# Patient Record
Sex: Female | Born: 1995 | Race: Black or African American | Hispanic: No | Marital: Single | Smoking: Never smoker
Health system: Southern US, Community
[De-identification: ages and names within clinical notes are randomized; demographics above are authoritative.]

## PROBLEM LIST (undated history)

## (undated) DIAGNOSIS — D6800 Von Willebrand disease, unspecified: Secondary | ICD-10-CM

---

## 2017-12-15 ENCOUNTER — Encounter (HOSPITAL_BASED_OUTPATIENT_CLINIC_OR_DEPARTMENT_OTHER): Payer: Self-pay | Admitting: *Deleted

## 2017-12-15 ENCOUNTER — Emergency Department (HOSPITAL_BASED_OUTPATIENT_CLINIC_OR_DEPARTMENT_OTHER)
Admission: EM | Admit: 2017-12-15 | Discharge: 2017-12-16 | Disposition: A | Discharge status: Home / Self Care | Attending: Emergency Medicine | Admitting: Emergency Medicine

## 2017-12-15 ENCOUNTER — Other Ambulatory Visit: Payer: Self-pay

## 2017-12-15 DIAGNOSIS — R350 Frequency of micturition: Secondary | ICD-10-CM | POA: Diagnosis not present

## 2017-12-15 DIAGNOSIS — R51 Headache: Secondary | ICD-10-CM | POA: Insufficient documentation

## 2017-12-15 DIAGNOSIS — R519 Headache, unspecified: Secondary | ICD-10-CM

## 2017-12-15 DIAGNOSIS — Z3202 Encounter for pregnancy test, result negative: Secondary | ICD-10-CM | POA: Insufficient documentation

## 2017-12-15 HISTORY — DX: Von Willebrand's disease: D68.0

## 2017-12-15 HISTORY — DX: Von Willebrand disease, unspecified: D68.00

## 2017-12-15 LAB — URINALYSIS, ROUTINE W REFLEX MICROSCOPIC
BILIRUBIN URINE: NEGATIVE
Glucose, UA: NEGATIVE mg/dL
Hgb urine dipstick: NEGATIVE
KETONES UR: 15 mg/dL — AB
Nitrite: NEGATIVE
PROTEIN: NEGATIVE mg/dL
Specific Gravity, Urine: >1.03 — ABNORMAL HIGH (ref 1.005–1.030)
pH: 6 (ref 5.0–8.0)

## 2017-12-15 LAB — URINALYSIS, MICROSCOPIC (REFLEX)

## 2017-12-15 LAB — PREGNANCY, URINE: PREG TEST UR: NEGATIVE

## 2017-12-15 NOTE — ED Triage Notes (Signed)
HA x 6 hours PTA. Also reports diarrhea that started today-4 episodes in the last 24 hours.  Denies vomiting.

## 2017-12-16 MED ORDER — NITROFURANTOIN MONOHYD MACRO 100 MG PO CAPS: 100.0000 mg | ORAL_CAPSULE | Freq: Two times a day (BID) | ORAL | 0 refills | Status: AC

## 2017-12-16 NOTE — ED Provider Notes (Signed)
[facility] EMERGENCY DEPARTMENT Provider Note   CSN: [id number] Arrival date & time: 12/15/17  2039     History   Chief Complaint Chief Complaint  Patient presents with  . Headache    HPI Anita Cannon is a 22 y.o. female.  Patient presents the emergency department with complaint of headache starting today.  Patient has a history of migraines which are typically described as throbbing with associated nausea, sometimes vomiting, and photophobia.  Her headache today felt similar but was less severe with nausea and no vomiting and mild photosensitivity.  No treatments prior to arrival.  At time of exam, symptoms had resolved.  Patient also reports having some generalized body aches, especially in her bilateral legs today.  She was concerned that she was getting the flu.  She denies any fever, URI symptoms, cough.  No chest pain or shortness of breath.  No abdominal pain.  Patient has had urinary frequency that has been bothersome for the past couple of weeks but no dysuria, hematuria, or vaginal discharge.  No skin rashes.  No head injury, neck pain, confusion. The onset of this condition was acute. The course is improving. Alleviating factors: none.        Past Medical History:  Diagnosis Date  . Von Willebrand disease (HCC)     There are no active problems to display for this patient.   History reviewed. No pertinent surgical history.  OB History    No data available       Home Medications    Prior to Admission medications   Medication Sig Start Date End Date Taking? Authorizing Provider  nitrofurantoin, macrocrystal-monohydrate, (MACROBID) 100 MG capsule Take 1 capsule (100 mg total) by mouth 2 (two) times daily. 12/16/17   Renne CriglerGeiple, Merie Wulf, PA-C    Family History History reviewed. No pertinent family history.  Social History Social History   Tobacco Use  . Smoking status: Never Smoker  . Smokeless tobacco: Never Used  Substance Use Topics  . Alcohol  use: No    Frequency: Never  . Drug use: No     Allergies   Sulfa antibiotics   Review of Systems Review of Systems  Constitutional: Negative for fever.  HENT: Negative for congestion, dental problem, rhinorrhea and sinus pressure.   Eyes: Positive for photophobia. Negative for discharge, redness and visual disturbance.  Respiratory: Negative for shortness of breath.   Cardiovascular: Negative for chest pain.  Gastrointestinal: Negative for nausea and vomiting.  Musculoskeletal: Positive for myalgias. Negative for gait problem, neck pain and neck stiffness.  Skin: Negative for rash.  Neurological: Positive for headaches. Negative for syncope, speech difficulty, weakness, light-headedness and numbness.  Psychiatric/Behavioral: Negative for confusion.     Physical Exam Updated Vital Signs BP 116/73 (BP Location: Right Arm)   Pulse 92   Temp 99 F (37.2 C) (Oral)   Resp 16   Ht 5\' 4"  (1.626 m)   Wt 54.4 kg (120 lb)   SpO2 100%   BMI 20.60 kg/m   Physical Exam  Constitutional: She is oriented to person, place, and time. She appears well-developed and well-nourished.  HENT:  Head: Normocephalic and atraumatic.  Right Ear: Tympanic membrane, external ear and ear canal normal.  Left Ear: Tympanic membrane, external ear and ear canal normal.  Nose: Nose normal.  Mouth/Throat: Uvula is midline, oropharynx is clear and moist and mucous membranes are normal.  Eyes: Conjunctivae, EOM and lids are normal. Pupils are equal, round, and reactive to light. Right  eye exhibits no nystagmus. Left eye exhibits no nystagmus.  Neck: Normal range of motion. Neck supple.  Cardiovascular: Normal rate and regular rhythm.  Pulmonary/Chest: Effort normal and breath sounds normal.  Abdominal: Soft. There is no tenderness.  Musculoskeletal: She exhibits no tenderness.       Cervical back: She exhibits normal range of motion, no tenderness and no bony tenderness.  Neurological: She is alert and  oriented to person, place, and time. She has normal strength and normal reflexes. No cranial nerve deficit or sensory deficit. She displays a negative Romberg sign. Coordination and gait normal. GCS eye subscore is 4. GCS verbal subscore is 5. GCS motor subscore is 6.  Skin: Skin is warm and dry.  Psychiatric: She has a normal mood and affect.  Nursing note and vitals reviewed.    ED Treatments / Results  Labs (all labs ordered are listed, but only abnormal results are displayed) Labs Reviewed  URINALYSIS, ROUTINE W REFLEX MICROSCOPIC - Abnormal; Notable for the following components:      Result Value   APPearance CLOUDY (*)    Specific Gravity, Urine >1.030 (*)    Ketones, ur 15 (*)    Leukocytes, UA SMALL (*)    All other components within normal limits  URINALYSIS, MICROSCOPIC (REFLEX) - Abnormal; Notable for the following components:   Bacteria, UA FEW (*)    Squamous Epithelial / LPF 6-30 (*)    All other components within normal limits  PREGNANCY, URINE    Procedures Procedures (including critical care time)   Initial Impression / Assessment and Plan / ED Course  I have reviewed the triage vital signs and the nursing notes.  Pertinent labs & imaging results that were available during my care of the patient were reviewed by me and considered in my medical decision making (see chart for details).     Patient seen and examined.  Patient had a headache upon arrival to the emergency department however this has spontaneously resolved and she is feeling much better.  We discussed results of her UA.  Given the fact that she has urinary frequency, feel that a 5-day course of Macrobid would be reasonable.  Her exam is normal.  Vital signs reviewed and are as follows: BP 116/61   Pulse 84   Temp 99 F (37.2 C) (Oral)   Resp 18   Ht 5\' 4"  (1.626 m)   Wt 54.4 kg (120 lb)   SpO2 100%   BMI 20.60 kg/m   Patient urged to return with worsening symptoms or other concerns.  Patient verbalized understanding and agrees with plan.    Final Clinical Impressions(s) / ED Diagnoses   Final diagnoses:  Acute nonintractable headache, unspecified headache type  Urinary frequency   Patient with headache and body aches today.  She does not have any other flulike symptoms.  Normal neuro exam. Patient without high-risk features of headache including: sudden onset/thunderclap HA, no similar headache in past, altered mental status, accompanying seizure, headache with exertion, age > 53, history of immunocompromise, neck or shoulder pain, fever, use of anticoagulation, family history of spontaneous SAH, concomitant drug use, toxic exposure.   Patient has a normal complete neurological exam, normal vital signs, normal level of consciousness, no signs of meningismus, is well-appearing/non-toxic appearing, no signs of trauma.   Imaging with CT/MRI not indicated given history and physical exam findings.   No dangerous or life-threatening conditions suspected or identified by history, physical exam, and by work-up. No indications for hospitalization  identified.   Urinary frequency without dysuria or suprapubic pain.  No vaginal symptoms per patient.  6-30 white blood cells with mild leuk esterase on UA.  Given bothersome symptoms, will treat empirically to see if this helps improve her situation.  No signs of pyelonephritis or sepsis.  ED Discharge Orders        Ordered    nitrofurantoin, macrocrystal-monohydrate, (MACROBID) 100 MG capsule  2 times daily     12/16/17 0045       Renne Crigler, PA-C 12/16/17 0054    Gilda Crease, MD 12/16/17 [phone]

## 2017-12-16 NOTE — Discharge Instructions (Signed)
Please read and follow all provided instructions.  Your diagnoses today include:  1. Acute nonintractable headache, unspecified headache type   2. Urinary frequency     Tests performed today include:  Urine test - suggests that you may have an infection in your bladder  Vital signs. See below for your results today.   Medications prescribed:   Macrobid - antibiotic for urinary tract infection  You have been prescribed an antibiotic medicine: take the entire course of medicine even if you are feeling better. Stopping early can cause the antibiotic not to work.  Home care instructions:  Follow any educational materials contained in this packet.  Follow-up instructions: Please follow-up with your primary care provider in 3 days if symptoms are not resolved for further evaluation of your symptoms.  Return instructions:   Please return to the Emergency Department if you experience worsening symptoms.   Return with fever, worsening pain, persistent vomiting, worsening pain in your back.   Please return if you have any other emergent concerns.  Additional Information:  Your vital signs today were: BP 116/73 (BP Location: Right Arm)    Pulse 92    Temp 99 F (37.2 C) (Oral)    Resp 16    Ht 5\' 4"  (1.626 m)    Wt 54.4 kg (120 lb)    SpO2 100%    BMI 20.60 kg/m  If your blood pressure (BP) was elevated above 135/85 this visit, please have this repeated by your doctor within one month. --------------

## 2017-12-31 ENCOUNTER — Ambulatory Visit (HOSPITAL_COMMUNITY)
Admission: EM | Admit: 2017-12-31 | Discharge: 2017-12-31 | Disposition: A | Discharge status: Home / Self Care | Attending: Family Medicine | Admitting: Family Medicine

## 2017-12-31 ENCOUNTER — Encounter (HOSPITAL_COMMUNITY): Payer: Self-pay | Admitting: Emergency Medicine

## 2017-12-31 DIAGNOSIS — R69 Illness, unspecified: Secondary | ICD-10-CM

## 2017-12-31 DIAGNOSIS — J111 Influenza due to unidentified influenza virus with other respiratory manifestations: Secondary | ICD-10-CM

## 2017-12-31 MED ORDER — OSELTAMIVIR PHOSPHATE 75 MG PO CAPS: 75.0000 mg | ORAL_CAPSULE | Freq: Two times a day (BID) | ORAL | 0 refills | Status: AC

## 2017-12-31 MED ORDER — HYDROCODONE-HOMATROPINE 5-1.5 MG/5ML PO SYRP: 5.0000 mL | ORAL_SOLUTION | Freq: Four times a day (QID) | ORAL | 0 refills | Status: AC | PRN

## 2017-12-31 NOTE — ED Provider Notes (Signed)
  [facility]   [id number] 12/31/17 Arrival Time: 1304  ASSESSMENT & PLAN:  1. Influenza-like illness     Meds ordered this encounter  Medications  . HYDROcodone-homatropine (HYCODAN) 5-1.5 MG/5ML syrup    Sig: Take 5 mLs by mouth every 6 (six) hours as needed for cough.    Dispense:  90 mL    Refill:  0  . oseltamivir (TAMIFLU) 75 MG capsule    Sig: Take 1 capsule (75 mg total) by mouth 2 (two) times daily for 5 days.    Dispense:  10 capsule    Refill:  0   School note given. Cough medication sedation precautions. Discussed typical duration of symptoms. OTC symptom care as needed. Ensure adequate fluid intake and rest. May f/u with PCP or here as needed.  Reviewed expectations re: course of current medical issues. Questions answered. Outlined signs and symptoms indicating need for more acute intervention. Patient verbalized understanding. After Visit Summary given.   SUBJECTIVE: History from: patient.  Anita Cannon is a 22 y.o. female who presents with complaint of nasal congestion, post-nasal drainage, and a persistent dry cough. Onset abrupt, approximately 2 days ago. Overall fatigued with body aches. SOB: none. Wheezing: none. Fever: yes, subjective. Overall normal PO intake without n/v. Sick contacts: no. OTC treatment: none.  Received flu shot this year: yes.  Social History   Tobacco Use  Smoking Status Never Smoker  Smokeless Tobacco Never Used    ROS: As per HPI.   OBJECTIVE:  Vitals:   12/31/17 1326 12/31/17 1327  BP:  (!) 93/54  Pulse:  (!) 102  Resp:  16  Temp:  99.5 F (37.5 C)  TempSrc:  Oral  SpO2:  97%  Weight: 120 lb (54.4 kg)      General appearance: alert; appears fatigued HEENT: nasal congestion; clear runny nose; throat irritation secondary to post-nasal drainage Neck: supple without LAD Lungs: unlabored respirations, symmetrical air entry; cough: moderate; no respiratory distress Skin: warm and dry Psychological:  alert and cooperative; normal mood and affect   Allergies  Allergen Reactions  . Sulfa Antibiotics     Past Medical History:  Diagnosis Date  . Von Willebrand disease (HCC)    No family history on file. Social History   Socioeconomic History  . Marital status: Single    Spouse name: Not on file  . Number of children: Not on file  . Years of education: Not on file  . Highest education level: Not on file  Social Needs  . Financial resource strain: Not on file  . Food insecurity - worry: Not on file  . Food insecurity - inability: Not on file  . Transportation needs - medical: Not on file  . Transportation needs - non-medical: Not on file  Occupational History  . Not on file  Tobacco Use  . Smoking status: Never Smoker  . Smokeless tobacco: Never Used  Substance and Sexual Activity  . Alcohol use: No    Frequency: Never  . Drug use: No  . Sexual activity: No  Other Topics Concern  . Not on file  Social History Narrative  . Not on file           Mardella LaymanHagler, Jaden Batchelder, MD 12/31/17 1346

## 2017-12-31 NOTE — ED Triage Notes (Signed)
PT reports congestion that started 2 days ago.

## 2017-12-31 NOTE — Discharge Instructions (Signed)

## 2018-09-11 ENCOUNTER — Other Ambulatory Visit: Payer: Self-pay

## 2018-09-11 ENCOUNTER — Emergency Department (HOSPITAL_COMMUNITY)
Admission: EM | Admit: 2018-09-11 | Discharge: 2018-09-12 | Disposition: A | Discharge status: Home / Self Care | Attending: Emergency Medicine | Admitting: Emergency Medicine

## 2018-09-11 ENCOUNTER — Encounter (HOSPITAL_COMMUNITY): Payer: Self-pay | Admitting: Emergency Medicine

## 2018-09-11 DIAGNOSIS — R040 Epistaxis: Secondary | ICD-10-CM

## 2018-09-11 DIAGNOSIS — Z79899 Other long term (current) drug therapy: Secondary | ICD-10-CM | POA: Insufficient documentation

## 2018-09-11 DIAGNOSIS — D680 Von Willebrand's disease: Secondary | ICD-10-CM | POA: Insufficient documentation

## 2018-09-11 DIAGNOSIS — R55 Syncope and collapse: Secondary | ICD-10-CM | POA: Diagnosis not present

## 2018-09-11 LAB — CBG MONITORING, ED: GLUCOSE-CAPILLARY: 158 mg/dL — AB (ref 70–99)

## 2018-09-11 MED ORDER — LIDOCAINE-EPINEPHRINE (PF) 2 %-1:200000 IJ SOLN
10.0000 mL | Freq: Once | INTRAMUSCULAR | Status: AC
  Administered 2018-09-11: 10 mL
  Filled 2018-09-11: qty 10

## 2018-09-11 NOTE — ED Triage Notes (Signed)
Patient is from home, called EMS for a nose bleed tonight.  Patient has Von Willebrand's.  Patient was given afrin which helped her nosebleed.  She then went pale, diaphoretic and passed out.  Patient does have some nausea, no vomiting.  No hypotension per EMS.

## 2018-09-11 NOTE — ED Provider Notes (Signed)
[facility] EMERGENCY DEPARTMENT Provider Note   CSN: [id number] Arrival date & time: 09/11/18  2329     History   Chief Complaint Chief Complaint  Patient presents with  . Loss of Consciousness    HPI Anita Cannon is a 22 y.o. female.  HPI  The pt is a 22 y/o female -  Hx of VWD, no sig hx of bleeding - [place] - presents with nose bleed - likely from the L nostril - started after blowing her nose this evening - then had syncopal episode on the commode - came around as the paramedics came to get her - she has no c/o other than mild nose bleed - improved after EMS gave afrin.  She has had mild URI X 2 days - hence runny nose with blowing which started this.  Past Medical History:  Diagnosis Date  . Von Willebrand disease (HCC)     There are no active problems to display for this patient.   History reviewed. No pertinent surgical history.   OB History   None      Home Medications    Prior to Admission medications   Medication Sig Start Date End Date Taking? Authorizing Provider  tranexamic acid (LYSTEDA) 650 MG TABS tablet Take 1,300 mg by mouth 3 (three) times daily as needed. For a maximum of 5 days during monthly menstruation 07/30/18  Yes [provider]  HYDROcodone-homatropine (HYCODAN) 5-1.5 MG/5ML syrup Take 5 mLs by mouth every 6 (six) hours as needed for cough. Patient not taking: Reported on 09/12/2018 12/31/17   Mardella Layman, MD  nitrofurantoin, macrocrystal-monohydrate, (MACROBID) 100 MG capsule Take 1 capsule (100 mg total) by mouth 2 (two) times daily. Patient not taking: Reported on 09/12/2018 12/16/17   Renne Crigler, PA-C    Family History No family history on file.  Social History Social History   Tobacco Use  . Smoking status: Never Smoker  . Smokeless tobacco: Never Used  Substance Use Topics  . Alcohol use: No    Frequency: Never  . Drug use: No     Allergies   Sulfa antibiotics   Review of  Systems Review of Systems  HENT: Positive for nosebleeds.   Respiratory: Negative for cough and shortness of breath.   Gastrointestinal: Negative for nausea.  Neurological: Positive for syncope.     Physical Exam Updated Vital Signs BP (!) 134/95   Pulse (!) 104   Temp 98.3 F (36.8 C) (Oral)   Resp 20   Ht 1.626 m (5\' 4" )   Wt 60.8 kg   SpO2 100%   BMI 23.00 kg/m   Physical Exam  Constitutional: She appears well-developed and well-nourished. No distress.  HENT:  Head: Normocephalic and atraumatic.  Mouth/Throat: Oropharynx is clear and moist. No oropharyngeal exudate.  No blood in OP, has blood in L nare -- small am't in R nare - no active bleeding.  Eyes: Pupils are equal, round, and reactive to light. Conjunctivae and EOM are normal. Right eye exhibits no discharge. Left eye exhibits no discharge. No scleral icterus.  Neck: Normal range of motion. Neck supple. No JVD present. No thyromegaly present.  Cardiovascular: Normal rate, regular rhythm, normal heart sounds and intact distal pulses. Exam reveals no gallop and no friction rub.  No murmur heard. Pulmonary/Chest: Effort normal and breath sounds normal. No respiratory distress. She has no wheezes. She has no rales.  Abdominal: Soft. Bowel sounds are normal. She exhibits no distension and no mass.  There is no tenderness.  Musculoskeletal: Normal range of motion. She exhibits no edema or tenderness.  Lymphadenopathy:    She has no cervical adenopathy.  Neurological: She is alert. Coordination normal.  Skin: Skin is warm and dry. No rash noted. No erythema.  Psychiatric: She has a normal mood and affect. Her behavior is normal.  Nursing note and vitals reviewed.    ED Treatments / Results  Labs (all labs ordered are listed, but only abnormal results are displayed) Labs Reviewed  CBC WITH DIFFERENTIAL/PLATELET - Abnormal; Notable for the following components:      Result Value   Hemoglobin 11.7 (*)    HCT 34.9 (*)     All other components within normal limits  CBG MONITORING, ED - Abnormal; Notable for the following components:   Glucose-Capillary 158 (*)    All other components within normal limits    EKG EKG Interpretation  Date/Time:  Saturday September 11 2018 23:35:45 EDT Ventricular Rate:  101 PR Interval:    QRS Duration: 89 QT Interval:  346 QTC Calculation: 449 R Axis:   71 Text Interpretation:  Sinus tachycardia No old tracing to compare ECG OTHERWISE WITHIN NORMAL LIMITS Confirmed by Eber Hong ([id number]) on 09/11/2018 11:38:17 PM   Radiology No results found.  Procedures .Epistaxis Management Date/Time: 09/12/2018 1:09 AM Performed by: Eber Hong, MD Authorized by: Eber Hong, MD   Consent:    Consent obtained:  Verbal   Consent given by:  Patient   Risks discussed:  Bleeding, infection, nasal injury and pain   Alternatives discussed:  No treatment and delayed treatment Anesthesia (see MAR for exact dosages):    Anesthesia method:  Topical application   Topical anesthetic:  Epinephrine Procedure details:    Treatment site:  L anterior   Treatment method:  Silver nitrate   Treatment complexity:  Limited   Treatment episode: initial   Post-procedure details:    Assessment:  Bleeding stopped   Patient tolerance of procedure:  Tolerated well, no immediate complications Comments:         (including critical care time)  Medications Ordered in ED Medications  lidocaine-EPINEPHrine (XYLOCAINE W/EPI) 2 %-1:200000 (PF) injection 10 mL (10 mLs Other Given by Other 09/11/18 2351)     Initial Impression / Assessment and Plan / ED Course  I have reviewed the triage vital signs and the nursing notes.  Pertinent labs & imaging results that were available during my care of the patient were reviewed by me and considered in my medical decision making (see chart for details).  Clinical Course as of Sep 12 110  Sun Sep 12, 2018  0020 Hgb Is 11.7, no tachcardia, EKG  unremarkable   [BM]    Clinical Course User Index [BM] Eber Hong, MD    Wll appearing, Check Hgb and EKG given Syncope - though likely situational Epistaxis management  Final Clinical Impressions(s) / ED Diagnoses   Final diagnoses:  Epistaxis  Syncope, unspecified syncope type      Eber Hong, MD 09/12/18 0111

## 2018-09-12 LAB — CBC WITH DIFFERENTIAL/PLATELET
Abs Immature Granulocytes: 0.06 10*3/uL (ref 0.00–0.07)
Basophils Absolute: 0.1 10*3/uL (ref 0.0–0.1)
Basophils Relative: 1 %
EOS ABS: 0.3 10*3/uL (ref 0.0–0.5)
EOS PCT: 4 %
HEMATOCRIT: 34.9 % — AB (ref 36.0–46.0)
Hemoglobin: 11.7 g/dL — ABNORMAL LOW (ref 12.0–15.0)
Immature Granulocytes: 1 %
LYMPHS ABS: 2.7 10*3/uL (ref 0.7–4.0)
Lymphocytes Relative: 38 %
MCH: 27.6 pg (ref 26.0–34.0)
MCHC: 33.5 g/dL (ref 30.0–36.0)
MCV: 82.3 fL (ref 80.0–100.0)
Monocytes Absolute: 0.7 10*3/uL (ref 0.1–1.0)
Monocytes Relative: 10 %
NEUTROS PCT: 46 %
Neutro Abs: 3.3 10*3/uL (ref 1.7–7.7)
Platelets: 240 10*3/uL (ref 150–400)
RBC: 4.24 MIL/uL (ref 3.87–5.11)
RDW: 13.2 % (ref 11.5–15.5)
WBC: 7.1 10*3/uL (ref 4.0–10.5)
nRBC: 0 % (ref 0.0–0.2)

## 2018-09-12 NOTE — Discharge Instructions (Signed)
Please apply a vaseline to the inside of the nostrils twice daily for the next week - this will help to prevent rebleeding If you do rebleed, please blow the clot out, apply afrin and hold pressure which may stop the bleeding.  Avoid sneezing and picking Use a humidifier for the next week ER if the bleeding continues or won't stop

## 2018-09-12 NOTE — ED Notes (Signed)
Patient left at this time with all belongings. 

## 2018-09-12 NOTE — ED Notes (Signed)
EDP at bedside performing bedside procedure

## 2019-09-01 ENCOUNTER — Other Ambulatory Visit: Payer: Self-pay

## 2019-09-01 DIAGNOSIS — Z20822 Contact with and (suspected) exposure to covid-19: Secondary | ICD-10-CM

## 2019-09-03 LAB — NOVEL CORONAVIRUS, NAA: SARS-CoV-2, NAA: NOT DETECTED

## 2019-10-26 ENCOUNTER — Emergency Department
Admission: EM | Admit: 2019-10-26 | Discharge: 2019-10-26 | Disposition: A | Discharge status: Home / Self Care | Payer: BC Managed Care – PPO | Attending: Emergency Medicine | Admitting: Emergency Medicine

## 2019-10-26 ENCOUNTER — Emergency Department: Payer: BC Managed Care – PPO

## 2019-10-26 ENCOUNTER — Other Ambulatory Visit: Payer: Self-pay

## 2019-10-26 ENCOUNTER — Encounter: Payer: Self-pay | Admitting: Intensive Care

## 2019-10-26 DIAGNOSIS — Z79899 Other long term (current) drug therapy: Secondary | ICD-10-CM | POA: Insufficient documentation

## 2019-10-26 DIAGNOSIS — R109 Unspecified abdominal pain: Secondary | ICD-10-CM

## 2019-10-26 LAB — COMPREHENSIVE METABOLIC PANEL
ALT: 15 U/L (ref 0–44)
AST: 19 U/L (ref 15–41)
Albumin: 4.7 g/dL (ref 3.5–5.0)
Alkaline Phosphatase: 82 U/L (ref 38–126)
Anion gap: 8 (ref 5–15)
BUN: 12 mg/dL (ref 6–20)
CO2: 22 mmol/L (ref 22–32)
Calcium: 9.3 mg/dL (ref 8.9–10.3)
Chloride: 108 mmol/L (ref 98–111)
Creatinine, Ser: 0.63 mg/dL (ref 0.44–1.00)
GFR calc Af Amer: >60 mL/min (ref >60)
GFR calc non Af Amer: >60 mL/min (ref >60)
Glucose, Bld: 94 mg/dL (ref 70–99)
Potassium: 4 mmol/L (ref 3.5–5.1)
Sodium: 138 mmol/L (ref 135–145)
Total Bilirubin: 0.5 mg/dL (ref 0.3–1.2)
Total Protein: 7.5 g/dL (ref 6.5–8.1)

## 2019-10-26 LAB — CBC
HCT: 34.9 % — ABNORMAL LOW (ref 36.0–46.0)
Hemoglobin: 12.3 g/dL (ref 12.0–15.0)
MCH: 28.7 pg (ref 26.0–34.0)
MCHC: 35.2 g/dL (ref 30.0–36.0)
MCV: 81.5 fL (ref 80.0–100.0)
Platelets: 259 10*3/uL (ref 150–400)
RBC: 4.28 MIL/uL (ref 3.87–5.11)
RDW: 13.3 % (ref 11.5–15.5)
WBC: 8.8 10*3/uL (ref 4.0–10.5)
nRBC: 0 % (ref 0.0–0.2)

## 2019-10-26 LAB — URINALYSIS, COMPLETE (UACMP) WITH MICROSCOPIC
Bacteria, UA: NONE SEEN
Bilirubin Urine: NEGATIVE
Glucose, UA: NEGATIVE mg/dL
Hgb urine dipstick: NEGATIVE
Ketones, ur: NEGATIVE mg/dL
Leukocytes,Ua: NEGATIVE
Nitrite: NEGATIVE
Protein, ur: NEGATIVE mg/dL
Specific Gravity, Urine: 1.018 (ref 1.005–1.030)
pH: 5 (ref 5.0–8.0)

## 2019-10-26 LAB — POCT PREGNANCY, URINE: Preg Test, Ur: NEGATIVE

## 2019-10-26 LAB — LIPASE, BLOOD: Lipase: 23 U/L (ref 11–51)

## 2019-10-26 NOTE — ED Notes (Signed)
Patient transported to CT 

## 2019-10-26 NOTE — ED Provider Notes (Signed)
[facility] Emergency Department Provider Note  Time seen: 6:26 PM  I have reviewed the triage vital signs and the nursing notes.   HISTORY  Chief Complaint Abdominal Pain   HPI Anita Cannon is a 23 y.o. female with a past medical history of von Willebrand, presents to the emergency department for right flank pain.  According to the patient approximately 12 days ago she developed fairly significant sharp right flank pain while urinating.  States ever since that time she has had intermittent sharp pains in the right mid back and right mid abdomen.  States they will last several seconds to minutes and then will go away.  Denies any dysuria or urinary frequency.  Denies any hematuria vaginal bleeding or discharge.  Patient is on Depo-Provera and does not know when her last period was.  Denies any fever cough shortness of breath.   No nausea vomiting or diarrhea.  Past Medical History:  Diagnosis Date  . Von Willebrand disease ([place])     There are no problems to display for this patient.   History reviewed. No pertinent surgical history.  Prior to Admission medications   Medication Sig Start Date End Date Taking? Authorizing Provider  HYDROcodone-homatropine (HYCODAN) 5-1.5 MG/5ML syrup Take 5 mLs by mouth every 6 (six) hours as needed for cough. Patient not taking: Reported on 09/12/2018 12/31/17   Vanessa Kick, MD  nitrofurantoin, macrocrystal-monohydrate, (MACROBID) 100 MG capsule Take 1 capsule (100 mg total) by mouth 2 (two) times daily. Patient not taking: Reported on 09/12/2018 12/16/17   Carlisle Cater, PA-C  tranexamic acid (LYSTEDA) 650 MG TABS tablet Take 1,300 mg by mouth 3 (three) times daily as needed. For a maximum of 5 days during monthly menstruation 07/30/18   [provider]    Allergies  Allergen Reactions  . Sulfa Antibiotics Other (See Comments)    Pt used sulfa eye drops and eyes became swollen    History reviewed. No pertinent  family history.  Social History Social History   Tobacco Use  . Smoking status: Never Smoker  . Smokeless tobacco: Never Used  Substance Use Topics  . Alcohol use: Yes    Comment: occ  . Drug use: No    Review of Systems Constitutional: Negative for fever. Cardiovascular: Negative for chest pain. Respiratory: Negative for shortness of breath. Gastrointestinal: Positive for right flank pain. Genitourinary: Negative for dysuria or hematuria. Musculoskeletal: Negative for musculoskeletal complaints Neurological: Negative for headache All other ROS negative  ____________________________________________   PHYSICAL EXAM:  VITAL SIGNS: ED Triage Vitals  Enc Vitals Group     BP 10/26/19 1717 120/78     Pulse Rate 10/26/19 1717 75     Resp 10/26/19 1717 14     Temp 10/26/19 1717 98.7 F (37.1 C)     Temp Source 10/26/19 1717 Oral     SpO2 10/26/19 1717 100 %     Weight 10/26/19 1715 140 lb (63.5 kg)     Height 10/26/19 1715 5\' 4"  (1.626 m)     Head Circumference --      Peak Flow --      Pain Score 10/26/19 1715 1     Pain Loc --      Pain Edu? --      Excl. in [place]? --    Constitutional: Alert and oriented. Well appearing and in no distress. Eyes: Normal exam ENT      Head: Normocephalic and atraumatic      Mouth/Throat: Mucous  membranes are moist. Cardiovascular: Normal rate, regular rhythm.  Respiratory: Normal respiratory effort without tachypnea nor retractions. Breath sounds are clear  Gastrointestinal: Soft, slight right mid abdominal tenderness.  No CVA tenderness.  No rebound guarding or distention. Musculoskeletal: Nontender with normal range of motion in all extremities.  Neurologic:  Normal speech and language. No gross focal neurologic deficits Skin:  Skin is warm, dry and intact.  Psychiatric: Mood and affect are normal.  ____________________________________________    RADIOLOGY  CT scan is  negative  ____________________________________________   INITIAL IMPRESSION / ASSESSMENT AND PLAN / ED COURSE  Pertinent labs & imaging results that were available during my care of the patient were reviewed by me and considered in my medical decision making (see chart for details).   Patient presents to the emergency department for right flank pain.  Patient states the pain has been sharp and intermittent over the past nearly 2 weeks now.  First occurred December 4 when she was urinating developed a sharp pain in her right mid back.  No hematuria, no vaginal bleeding or discharge.  No dysuria.  No history of kidney stones known to the patient.  Patient's lab work is overall reassuring, urinalysis is normal.  Vitals are normal.  Patient has a reassuring exam with only very slight tenderness in the right mid abdomen.  Given the patient's description of her pain we will obtain a CT renal scan to rule out ureterolithiasis or other intra-abdominal pathology.  Patient agreeable to plan of care.  CT scan is negative for acute abnormality.  Overall the patient appears well with a reassuring work-up.  We will discharge patient home with PCP follow-up.  [name] was evaluated in Emergency Department on 10/26/2019 for the symptoms described in the history of present illness. She was evaluated in the context of the global COVID-19 pandemic, which necessitated consideration that the patient might be at risk for infection with the SARS-CoV-2 virus that causes COVID-19. Institutional protocols and algorithms that pertain to the evaluation of patients at risk for COVID-19 are in a state of rapid change based on information released by regulatory bodies including the CDC and federal and state organizations. These policies and algorithms were followed during the patient's care in the ED.  ____________________________________________   FINAL CLINICAL IMPRESSION(S) / ED DIAGNOSES  Right flank pain    Minna Antis, MD 10/26/19 1900

## 2019-10-26 NOTE — ED Triage Notes (Signed)
C/o sharp pain in RLQ that started December 4th,2020. Denies N/V/D

## 2019-11-24 ENCOUNTER — Ambulatory Visit: Attending: Internal Medicine

## 2019-11-24 DIAGNOSIS — Z20822 Contact with and (suspected) exposure to covid-19: Secondary | ICD-10-CM

## 2019-11-25 LAB — NOVEL CORONAVIRUS, NAA: SARS-CoV-2, NAA: NOT DETECTED

## 2019-12-19 ENCOUNTER — Ambulatory Visit: Attending: Internal Medicine

## 2019-12-19 DIAGNOSIS — Z20822 Contact with and (suspected) exposure to covid-19: Secondary | ICD-10-CM

## 2019-12-20 LAB — NOVEL CORONAVIRUS, NAA: SARS-CoV-2, NAA: NOT DETECTED

## 2019-12-20 LAB — SPECIMEN STATUS REPORT

## 2020-02-24 IMAGING — CT CT RENAL STONE PROTOCOL
3 of 4 series · 8 of 46 positions shown, 15 images · non-contrast
Comparison: None.

CLINICAL DATA: Right flank pain

EXAM:
CT ABDOMEN AND PELVIS WITHOUT CONTRAST
TECHNIQUE: Multidetector CT imaging of the abdomen and pelvis was performed
following the standard protocol without IV contrast.

[Series 4: lung bases · axial · 0.73mm/px · z∈[-122,-62]mm · 4 of 20 slices shown, 9 images]
[im 4/20  soft-tissue]
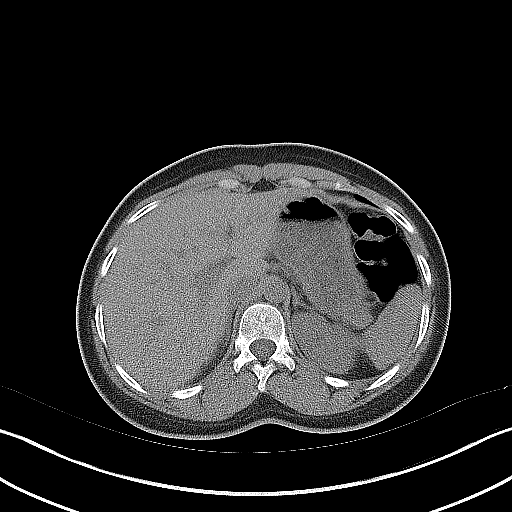
[im 4/20  lung]
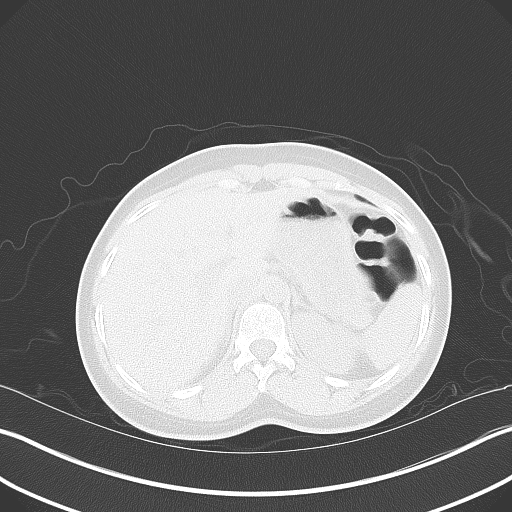
[im 4/20  bone]
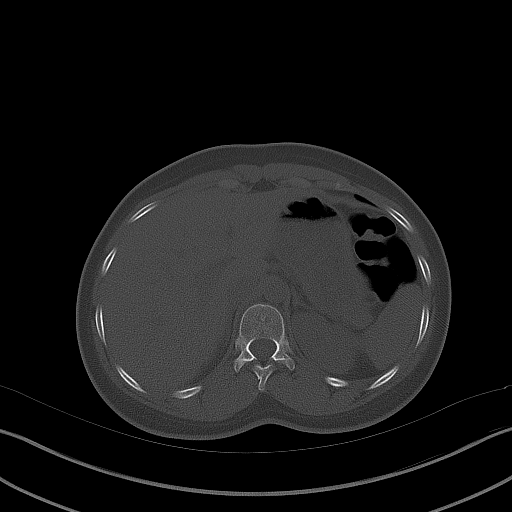
[im 8/20  soft-tissue]
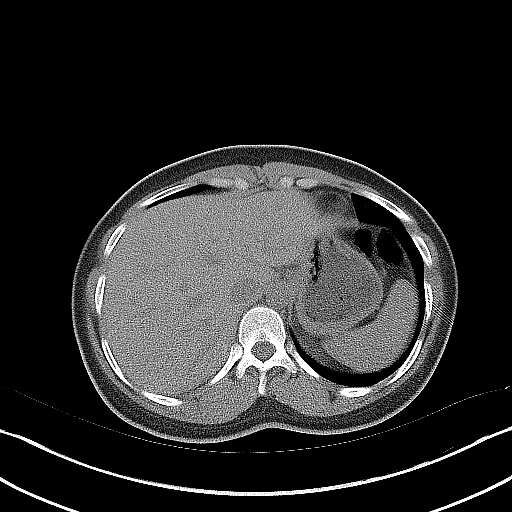
[im 8/20  lung]
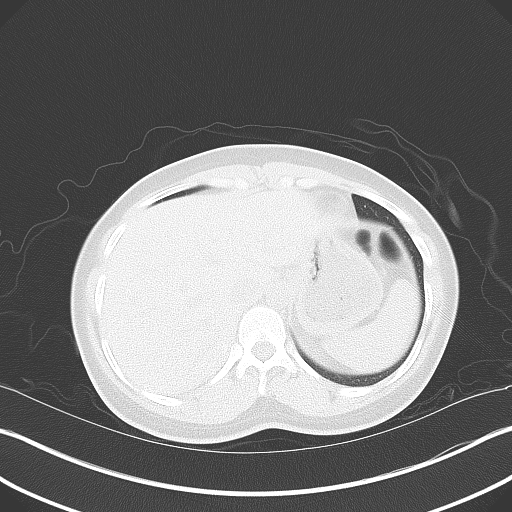
[im 12/20  soft-tissue]
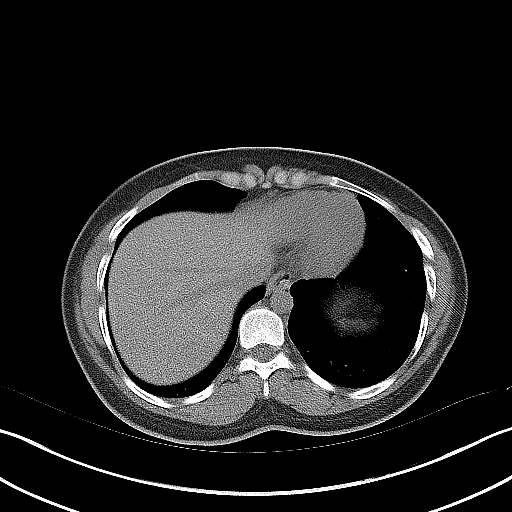
[im 12/20  lung]
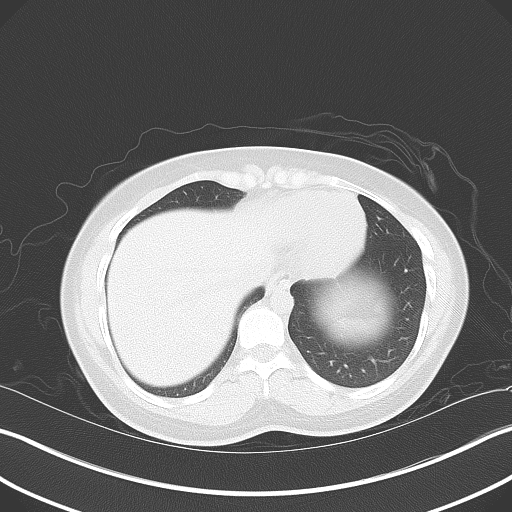
[im 16/20  soft-tissue]
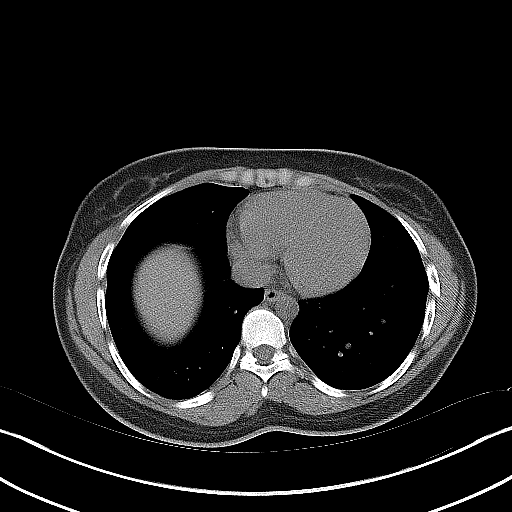
[im 16/20  lung]
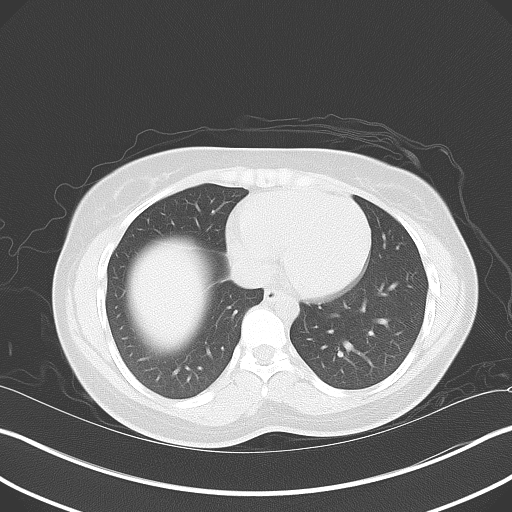

[Series 5: coronal · coronal · 0.79mm/px · 3 of 102 slices shown, 4 images]
[im 34/102  soft-tissue]
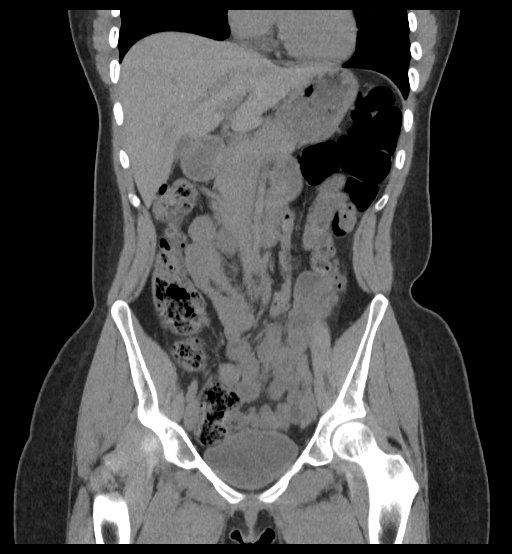
[im 45/102  soft-tissue]
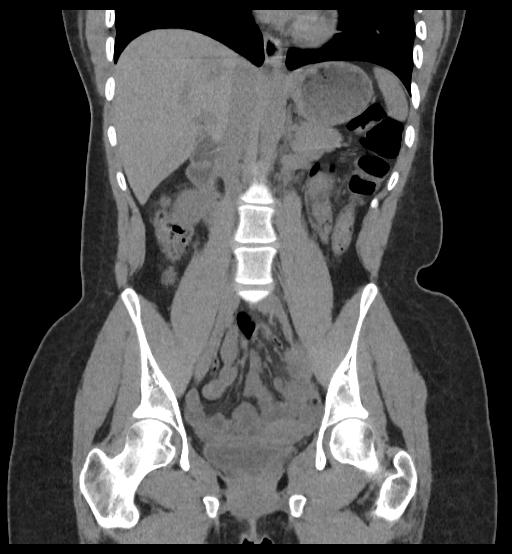
[im 45/102  bone]
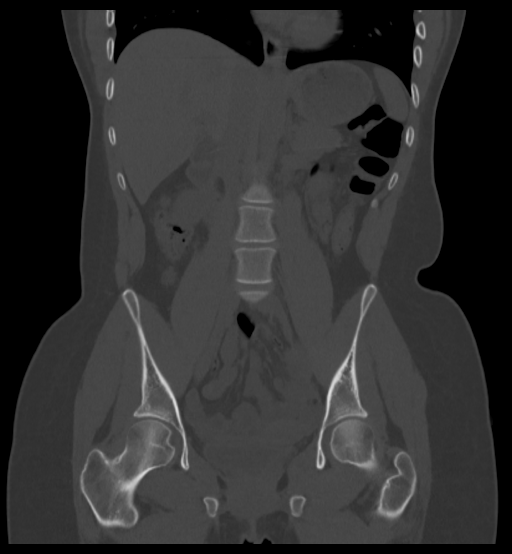
[im 57/102  soft-tissue]
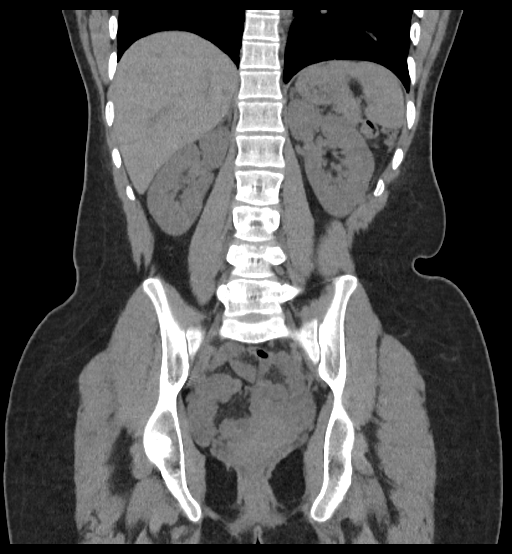

[Series 6: sagittal · sagittal · 0.61mm/px · 1 of 140 slices shown, 2 images]
[im 47/140  soft-tissue]
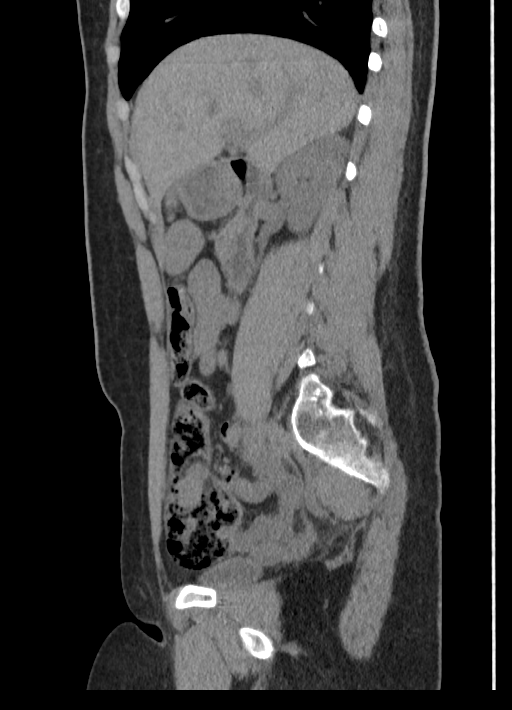
[im 47/140  bone]
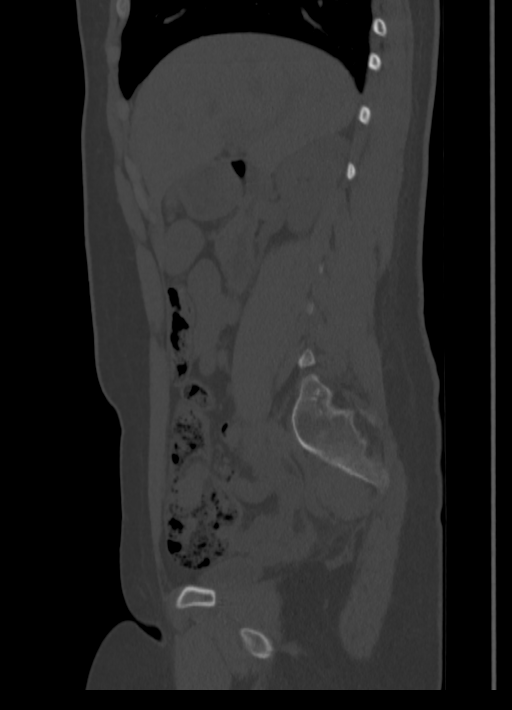

[8 of 46 positions shown; findings below may reference images not displayed]

FINDINGS: Lower chest: No acute abnormality.

Hepatobiliary: No focal liver abnormality is seen. No gallstones,
gallbladder wall thickening, or biliary dilatation.

Pancreas: Unremarkable. No pancreatic ductal dilatation or
surrounding inflammatory changes.

Spleen: Normal in size without focal abnormality.

Adrenals/Urinary Tract: Unremarkable adrenal glands. Bilateral
kidneys have an unremarkable noncontrast appearance. No renal
calculi. No hydronephrosis. The ureters are nondilated. No ureteral
calculi. Urinary bladder is unremarkable.

Stomach/Bowel: Stomach is within normal limits. Normal air-filled
appendix within the right lower quadrant. No evidence of bowel wall
thickening, distention, or inflammatory changes.

Vascular/Lymphatic: No significant vascular findings are present. No
enlarged abdominal or pelvic lymph nodes.

Reproductive: Uterus and bilateral adnexa are unremarkable.

Other: No abdominal wall hernia or abnormality. No abdominopelvic
ascites.

Musculoskeletal: No acute or significant osseous findings.
IMPRESSION: No acute abdominopelvic findings. No evidence of obstructive
uropathy. Normal appendix.
